# Patient Record
Sex: Male | Born: 1990
Health system: Southern US, Community
[De-identification: ages and names within clinical notes are randomized; demographics above are authoritative.]

## PROBLEM LIST (undated history)

## (undated) DIAGNOSIS — E119 Type 2 diabetes mellitus without complications: Secondary | ICD-10-CM

## (undated) HISTORY — PX: SHOULDER SURGERY: SHX246

## (undated) HISTORY — PX: CHOLECYSTECTOMY: SHX55

---
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
## 2017-10-24 DIAGNOSIS — Z79899 Other long term (current) drug therapy: Secondary | ICD-10-CM | POA: Diagnosis not present

## 2017-10-24 DIAGNOSIS — Z6841 Body Mass Index (BMI) 40.0 and over, adult: Secondary | ICD-10-CM | POA: Diagnosis not present

## 2017-10-24 DIAGNOSIS — F988 Other specified behavioral and emotional disorders with onset usually occurring in childhood and adolescence: Secondary | ICD-10-CM | POA: Diagnosis not present

## 2017-10-24 DIAGNOSIS — R635 Abnormal weight gain: Secondary | ICD-10-CM | POA: Diagnosis not present

## 2017-12-12 DIAGNOSIS — Z6841 Body Mass Index (BMI) 40.0 and over, adult: Secondary | ICD-10-CM | POA: Diagnosis not present

## 2017-12-12 DIAGNOSIS — F988 Other specified behavioral and emotional disorders with onset usually occurring in childhood and adolescence: Secondary | ICD-10-CM | POA: Diagnosis not present

## 2017-12-12 DIAGNOSIS — Z79899 Other long term (current) drug therapy: Secondary | ICD-10-CM | POA: Diagnosis not present

## 2017-12-12 DIAGNOSIS — R635 Abnormal weight gain: Secondary | ICD-10-CM | POA: Diagnosis not present

## 2018-01-16 DIAGNOSIS — Z6841 Body Mass Index (BMI) 40.0 and over, adult: Secondary | ICD-10-CM | POA: Diagnosis not present

## 2018-01-16 DIAGNOSIS — F988 Other specified behavioral and emotional disorders with onset usually occurring in childhood and adolescence: Secondary | ICD-10-CM | POA: Diagnosis not present

## 2018-01-16 DIAGNOSIS — Z79899 Other long term (current) drug therapy: Secondary | ICD-10-CM | POA: Diagnosis not present

## 2018-01-16 DIAGNOSIS — R635 Abnormal weight gain: Secondary | ICD-10-CM | POA: Diagnosis not present

## 2018-03-01 DIAGNOSIS — E119 Type 2 diabetes mellitus without complications: Secondary | ICD-10-CM | POA: Diagnosis not present

## 2018-03-01 DIAGNOSIS — R109 Unspecified abdominal pain: Secondary | ICD-10-CM | POA: Diagnosis not present

## 2018-03-01 DIAGNOSIS — K8020 Calculus of gallbladder without cholecystitis without obstruction: Secondary | ICD-10-CM | POA: Diagnosis not present

## 2018-03-03 DIAGNOSIS — K760 Fatty (change of) liver, not elsewhere classified: Secondary | ICD-10-CM | POA: Diagnosis not present

## 2018-03-03 DIAGNOSIS — K8020 Calculus of gallbladder without cholecystitis without obstruction: Secondary | ICD-10-CM | POA: Diagnosis not present

## 2018-03-03 DIAGNOSIS — R1011 Right upper quadrant pain: Secondary | ICD-10-CM | POA: Insufficient documentation

## 2018-03-03 DIAGNOSIS — Z6841 Body Mass Index (BMI) 40.0 and over, adult: Secondary | ICD-10-CM | POA: Insufficient documentation

## 2018-03-06 DIAGNOSIS — K8010 Calculus of gallbladder with chronic cholecystitis without obstruction: Secondary | ICD-10-CM | POA: Diagnosis not present

## 2018-03-06 DIAGNOSIS — Z5331 Laparoscopic surgical procedure converted to open procedure: Secondary | ICD-10-CM | POA: Diagnosis not present

## 2018-03-06 DIAGNOSIS — E119 Type 2 diabetes mellitus without complications: Secondary | ICD-10-CM | POA: Diagnosis not present

## 2018-03-06 DIAGNOSIS — Z7984 Long term (current) use of oral hypoglycemic drugs: Secondary | ICD-10-CM | POA: Diagnosis not present

## 2018-03-07 DIAGNOSIS — E119 Type 2 diabetes mellitus without complications: Secondary | ICD-10-CM | POA: Diagnosis not present

## 2018-03-07 DIAGNOSIS — K8010 Calculus of gallbladder with chronic cholecystitis without obstruction: Secondary | ICD-10-CM | POA: Diagnosis not present

## 2018-03-07 DIAGNOSIS — Z7984 Long term (current) use of oral hypoglycemic drugs: Secondary | ICD-10-CM | POA: Diagnosis not present

## 2018-03-17 DIAGNOSIS — Z79899 Other long term (current) drug therapy: Secondary | ICD-10-CM | POA: Diagnosis not present

## 2018-03-17 DIAGNOSIS — Z6841 Body Mass Index (BMI) 40.0 and over, adult: Secondary | ICD-10-CM | POA: Diagnosis not present

## 2018-03-17 DIAGNOSIS — E1165 Type 2 diabetes mellitus with hyperglycemia: Secondary | ICD-10-CM | POA: Diagnosis not present

## 2018-03-17 DIAGNOSIS — Z0000 Encounter for general adult medical examination without abnormal findings: Secondary | ICD-10-CM | POA: Diagnosis not present

## 2018-03-23 DIAGNOSIS — Z09 Encounter for follow-up examination after completed treatment for conditions other than malignant neoplasm: Secondary | ICD-10-CM | POA: Insufficient documentation

## 2018-04-09 DIAGNOSIS — Z020 Encounter for examination for admission to educational institution: Secondary | ICD-10-CM | POA: Diagnosis not present

## 2018-04-09 DIAGNOSIS — Z6841 Body Mass Index (BMI) 40.0 and over, adult: Secondary | ICD-10-CM | POA: Diagnosis not present

## 2019-05-08 ENCOUNTER — Emergency Department (HOSPITAL_COMMUNITY)
Admission: EM | Admit: 2019-05-08 | Discharge: 2019-05-08 | Disposition: A | Discharge status: Home / Self Care | Payer: No Typology Code available for payment source | Attending: Emergency Medicine | Admitting: Emergency Medicine

## 2019-05-08 ENCOUNTER — Emergency Department (HOSPITAL_COMMUNITY): Payer: No Typology Code available for payment source

## 2019-05-08 ENCOUNTER — Other Ambulatory Visit: Payer: Self-pay

## 2019-05-08 ENCOUNTER — Encounter (HOSPITAL_COMMUNITY): Payer: Self-pay

## 2019-05-08 DIAGNOSIS — R0789 Other chest pain: Secondary | ICD-10-CM | POA: Diagnosis not present

## 2019-05-08 DIAGNOSIS — Z79899 Other long term (current) drug therapy: Secondary | ICD-10-CM | POA: Insufficient documentation

## 2019-05-08 DIAGNOSIS — R739 Hyperglycemia, unspecified: Secondary | ICD-10-CM

## 2019-05-08 DIAGNOSIS — E1165 Type 2 diabetes mellitus with hyperglycemia: Secondary | ICD-10-CM | POA: Insufficient documentation

## 2019-05-08 DIAGNOSIS — Z7984 Long term (current) use of oral hypoglycemic drugs: Secondary | ICD-10-CM | POA: Diagnosis not present

## 2019-05-08 HISTORY — DX: Type 2 diabetes mellitus without complications: E11.9

## 2019-05-08 LAB — URINALYSIS, ROUTINE W REFLEX MICROSCOPIC
Bacteria, UA: NONE SEEN
Bilirubin Urine: NEGATIVE
Glucose, UA: >=500 mg/dL — AB
Hgb urine dipstick: NEGATIVE
Ketones, ur: 20 mg/dL — AB
Leukocytes,Ua: NEGATIVE
Nitrite: NEGATIVE
Protein, ur: 30 mg/dL — AB
Specific Gravity, Urine: 1.037 — ABNORMAL HIGH (ref 1.005–1.030)
pH: 5 (ref 5.0–8.0)

## 2019-05-08 LAB — CBC
HCT: 47.1 % (ref 39.0–52.0)
Hemoglobin: 16 g/dL (ref 13.0–17.0)
MCH: 28.9 pg (ref 26.0–34.0)
MCHC: 34 g/dL (ref 30.0–36.0)
MCV: 85.2 fL (ref 80.0–100.0)
Platelets: 201 10*3/uL (ref 150–400)
RBC: 5.53 MIL/uL (ref 4.22–5.81)
RDW: 12 % (ref 11.5–15.5)
WBC: 7 10*3/uL (ref 4.0–10.5)
nRBC: 0 % (ref 0.0–0.2)

## 2019-05-08 LAB — BASIC METABOLIC PANEL
Anion gap: 11 (ref 5–15)
BUN: 15 mg/dL (ref 6–20)
CO2: 21 mmol/L — ABNORMAL LOW (ref 22–32)
Calcium: 8.5 mg/dL — ABNORMAL LOW (ref 8.9–10.3)
Chloride: 103 mmol/L (ref 98–111)
Creatinine, Ser: 0.84 mg/dL (ref 0.61–1.24)
GFR calc Af Amer: >60 mL/min (ref >60)
GFR calc non Af Amer: >60 mL/min (ref >60)
Glucose, Bld: 256 mg/dL — ABNORMAL HIGH (ref 70–99)
Potassium: 3.7 mmol/L (ref 3.5–5.1)
Sodium: 135 mmol/L (ref 135–145)

## 2019-05-08 LAB — RAPID URINE DRUG SCREEN, HOSP PERFORMED
Amphetamines: POSITIVE — AB
Barbiturates: NOT DETECTED
Benzodiazepines: NOT DETECTED
Cocaine: NOT DETECTED
Opiates: NOT DETECTED
Tetrahydrocannabinol: POSITIVE — AB

## 2019-05-08 LAB — CBG MONITORING, ED: Glucose-Capillary: 233 mg/dL — ABNORMAL HIGH (ref 70–99)

## 2019-05-08 LAB — TROPONIN I (HIGH SENSITIVITY)
Troponin I (High Sensitivity): 3 ng/L (ref <18)
Troponin I (High Sensitivity): 3 ng/L (ref <18)

## 2019-05-08 NOTE — ED Triage Notes (Signed)
Pt reports left sided chest pain that began at 0230. Pain woke him up. Pain was intermittent . Pt reports diaphoresis. Pt woke up and was better. Then reports that he went to [facility] and has left arm pain and chest tightness. Went to urgent care and sent here due to being diaphorrctic. Given asa and nitro with relief

## 2019-05-08 NOTE — Discharge Instructions (Signed)
It is important for you to follow up with your primary doctor this week as discussed arrange better control of your diabetes, but also to discuss further testing to ensure heart health.  Your lab test, EKGs and chest x-ray today are reassuring, but you would benefit from a cardiology follow-up to confirm heart health.  You are being given a referral to our local cardiologist, however you may want to discuss this with your primary doctor for cardiology follow-up closer to home.  Get rechecked immediately for any return or worsening symptoms.

## 2019-05-08 NOTE — ED Provider Notes (Signed)
[facility] Provider Note   CSN: [id number] Arrival date & time: 05/08/19  1235    History   Chief Complaint Chief Complaint  Patient presents with  . Chest Pain    HPI Coralee RudJason A Cesaro is a 28 y.o. male with a history of currently untreated diabetes presenting with left-sided chest pressure in association with diaphoresis which woke him from sleep around 230 today, and has been intermittently present since just prior to arrival.  He is currently camping here locally, his symptoms are not associated with exertion, however [facility] 1030 his symptoms escalated, describing left-sided chest pressure with radiation into his left arm, again with diaphoresis, but without nausea, vomiting, shortness of breath.  He presented at a local urgent care center and was transported here.  He received aspirin 324 mg and also nitroglycerin and had symptomatic relief.  No strong family history of CAD.  Patient is a non-smoker.  Daily EtOH use.  HPI: A 28 year old patient with a history of treated diabetes and obesity presents for evaluation of chest pain. Initial onset of pain was more than 6 hours ago. The patient's chest pain is described as heaviness/pressure/tightness, is not worse with exertion and is relieved by nitroglycerin. The patient reports some diaphoresis. The patient's chest pain is middle- or left-sided, is not well-localized, is not sharp and does radiate to the arms/jaw/neck. The patient does not complain of nausea. The patient has no history of stroke, has no history of peripheral artery disease, has not smoked in the past 90 days, has no relevant family history of coronary artery disease (first degree relative at less than age 30), is not hypertensive and has no history of hypercholesterolemia.   The history is provided by the patient.    Past Medical History:  Diagnosis Date  . Diabetes mellitus without complication (HCC)      There are no active problems to display for this patient.         Home Medications    Prior to Admission medications   Medication Sig Start Date End Date Taking? Authorizing Provider  amphetamine-dextroamphetamine (ADDERALL XR) 10 MG 24 hr capsule Take 10 mg by mouth every morning.    [provider]  metFORMIN (GLUCOPHAGE) 500 MG tablet Take 1 tablet by mouth 2 (two) times a day.    [provider]    Family History No family history on file.  Social History Social History   Tobacco Use  . Smoking status: Not on file  Substance Use Topics  . Alcohol use: Yes    Comment: 4-5 beer on weekend   . Drug use: Yes    Types: Marijuana     Allergies   Patient has no known allergies.   Review of Systems Review of Systems  Constitutional: Positive for diaphoresis. Negative for fever.  HENT: Negative for congestion and sore throat.   Eyes: Negative.   Respiratory: Positive for chest tightness. Negative for shortness of breath.   Cardiovascular: Negative for chest pain.  Gastrointestinal: Positive for nausea. Negative for abdominal pain.  Genitourinary: Negative.   Musculoskeletal: Negative for arthralgias, joint swelling and neck pain.  Skin: Negative.  Negative for rash and wound.  Neurological: Negative for dizziness, weakness, light-headedness, numbness and headaches.  Psychiatric/Behavioral: Negative.      Physical Exam Updated Vital Signs BP 123/83   Pulse 94   Temp 98.3 F (36.8 C) (Oral)   Resp 17   Ht 6\' 3"  (  1.905 m)   SpO2 99%   Physical Exam Vitals signs and nursing note reviewed.  Constitutional:      Appearance: He is well-developed.  HENT:     Head: Normocephalic and atraumatic.  Eyes:     Conjunctiva/sclera: Conjunctivae normal.  Neck:     Musculoskeletal: Normal range of motion.  Cardiovascular:     Rate and Rhythm: Normal rate and regular rhythm.     Heart sounds: Normal heart sounds.  Pulmonary:     Effort: Pulmonary  effort is normal.     Breath sounds: Normal breath sounds. No wheezing or rhonchi.  Abdominal:     General: Bowel sounds are normal.     Palpations: Abdomen is soft.     Tenderness: There is no abdominal tenderness.  Musculoskeletal: Normal range of motion.  Skin:    General: Skin is warm and dry.  Neurological:     Mental Status: He is alert.      ED Treatments / Results  Labs (all labs ordered are listed, but only abnormal results are displayed) Labs Reviewed  BASIC METABOLIC PANEL - Abnormal; Notable for the following components:      Result Value   CO2 21 (*)    Glucose, Bld 256 (*)    Calcium 8.5 (*)    All other components within normal limits  URINALYSIS, ROUTINE W REFLEX MICROSCOPIC - Abnormal; Notable for the following components:   Specific Gravity, Urine 1.037 (*)    Glucose, UA >=500 (*)    Ketones, ur 20 (*)    Protein, ur 30 (*)    All other components within normal limits  RAPID URINE DRUG SCREEN, HOSP PERFORMED - Abnormal; Notable for the following components:   Amphetamines POSITIVE (*)    Tetrahydrocannabinol POSITIVE (*)    All other components within normal limits  CBG MONITORING, ED - Abnormal; Notable for the following components:   Glucose-Capillary 233 (*)    All other components within normal limits  TROPONIN I (HIGH SENSITIVITY)  TROPONIN I (HIGH SENSITIVITY)  CBC    EKG EKG Interpretation  Date/Time:  Saturday May 08 2019 15:27:37 EDT Ventricular Rate:  96 PR Interval:    QRS Duration: 91 QT Interval:  342 QTC Calculation: 433 R Axis:   38 Text Interpretation:  Sinus rhythm Prolonged PR interval Low voltage, precordial leads Borderline T abnormalities, anterior leads Confirmed by Bethann BerkshireZammit, Joseph [phone]([id number]) on 05/08/2019 4:05:20 PM   Radiology Dg Chest Portable 1 View  Result Date: 05/08/2019 CLINICAL DATA:  Chest pain.  Diaphoresis.  Left arm pain. EXAM: PORTABLE CHEST 1 VIEW COMPARISON:  X-ray dated 02/22/2009 FINDINGS: The heart size  and mediastinal contours are within normal limits. Both lungs are clear. The visualized skeletal structures are unremarkable. IMPRESSION: Normal exam. Electronically Signed   By: Francene BoyersJames  Maxwell M.D.   On: 05/08/2019 14:02    Procedures Procedures (including critical care time)  Medications Ordered in ED Medications - No data to display   Initial Impression / Assessment and Plan / ED Course  I have reviewed the triage vital signs and the nursing notes.  Pertinent labs & imaging results that were available during my care of the patient were reviewed by me and considered in my medical decision making (see chart for details).     HEAR Score: 4  4:17 PM Pt still pain free. Pending second troponin.  Pt with sx distribution suggesting acs except it is not exertional and not associated with sob,n/v. Pt has been pain  and sx free here.  Delta troponin stable and normal range.  ekgs also unchanged. Doubt ACS.  VSS, no tachypnea or tachycardia, no risk factors for PE.   He reports has appt with his pcp this week (in [place]) to get back on his DM meds.    Hyperglycemia, pt with known DM.  He has not been compliant with taking any DM meds, metformin caused diarrhea, other meds tried by not covered by insurance.  No dka, pt to f/u with pcp this week regarding DM, also advised further eval by cardiology as outpt.  Referral given for this.  Pt agreeable and understands plan. Strict return precautions given.   Final Clinical Impressions(s) / ED Diagnoses   Final diagnoses:  Atypical chest pain  Hyperglycemia    ED Discharge Orders    None       Landis Martins 05/08/19 1711    Noemi Chapel, MD 05/11/19 [phone]

## 2019-09-03 ENCOUNTER — Ambulatory Visit (INDEPENDENT_AMBULATORY_CARE_PROVIDER_SITE_OTHER): Payer: No Typology Code available for payment source

## 2019-09-03 ENCOUNTER — Encounter: Payer: Self-pay | Admitting: Sports Medicine

## 2019-09-03 ENCOUNTER — Other Ambulatory Visit: Payer: Self-pay

## 2019-09-03 ENCOUNTER — Telehealth: Payer: Self-pay | Admitting: *Deleted

## 2019-09-03 ENCOUNTER — Ambulatory Visit (INDEPENDENT_AMBULATORY_CARE_PROVIDER_SITE_OTHER): Payer: No Typology Code available for payment source | Admitting: Sports Medicine

## 2019-09-03 DIAGNOSIS — S93602A Unspecified sprain of left foot, initial encounter: Secondary | ICD-10-CM

## 2019-09-03 DIAGNOSIS — M25572 Pain in left ankle and joints of left foot: Secondary | ICD-10-CM | POA: Diagnosis not present

## 2019-09-03 DIAGNOSIS — S96912A Strain of unspecified muscle and tendon at ankle and foot level, left foot, initial encounter: Secondary | ICD-10-CM

## 2019-09-03 DIAGNOSIS — R58 Hemorrhage, not elsewhere classified: Secondary | ICD-10-CM

## 2019-09-03 NOTE — Telephone Encounter (Signed)
-----   Message from Landis Martins, [place] sent at 09/03/2019 11:57 AM EDT ----- Regarding: MRI Left foot and ankle Severe pain and sprain of foot, fell and twisting foot after hike unable to flex or move ankle on left with ecchymosis to all toes and to ankle. MRI r/o ligament tear

## 2019-09-03 NOTE — Telephone Encounter (Signed)
Orders to J. Quintana, RN for pre-cert, faxed to [facility]. 

## 2019-09-03 NOTE — Telephone Encounter (Signed)
I called pt and asked if he would like to have the MRI at [facility] facility and he said yes.

## 2019-09-03 NOTE — Progress Notes (Signed)
Subjective:  Jonathan Hendrix is a 28 y.o. male patient who presents to office for evaluation of left foot and ankle pain. Patient complains of continued pain in the ankle unable to bend it is severe bruising to the toes after twisting his foot and ankle while he was hiking where he was going down heel and got his foot caught on a root.  Patient reports that he has been dealing with swelling discolored toes so he has been using crutches icing elevating and ibuprofen reports that it feels very tight around the toes and the ankle continues to hurt cannot flex it reports that there is sharp pain since the injury when he is attempting to put pressure on the foot so he has resulted to using crutches pain is reduced when he is off it to 3 out of 10. Patient denies any other pedal complaints at this time.  Review of Systems  All other systems reviewed and are negative.    Patient Active Problem List   Diagnosis Date Noted  . Postoperative examination 03/23/2018  . BMI 40.0-44.9, adult (HCC) 03/03/2018  . Gallstones 03/03/2018  . Morbid obesity (HCC) 03/03/2018  . Right upper quadrant pain 03/03/2018    No current outpatient medications on file prior to visit.   No current facility-administered medications on file prior to visit.     No Known Allergies  Objective:  General: Alert and oriented x3 in no acute distress  Dermatology: No open lesions bilateral lower extremities, no webspace macerations, moderate ecchymosis to the bases of toes 234 and 5 on left, all nails x 10 are well manicured.  Vascular: Dorsalis Pedis and Posterior Tibial pedal pulses palpable, Capillary Fill Time 3 seconds,(+) pedal hair growth bilateral, focal edema to the left foot and ankle temperature gradient within normal limits.  Neurology: Michaell Cowing sensation intact via light touch bilateral.  Musculoskeletal: Mild tenderness with palpation at anterior ankle with pain with dorsiflexion of the left ankle, there is mild  tenderness to palpation of the sinus tarsi, guarding with range of motion to left ankle.  No pain with calf pressure on left.  There is also mild pain with dorsiflexion of lesser digits on the left.    Gait: Antalgic gait, crutch assisted  Xrays  Left foot and ankle   Impression: There is mild joint space narrowing consistent with early arthritis versus old history of remote injury or trauma, at the head of the talus there is a small chip avulsion hard to determine if this chip avulsion is new versus old.  There is mild soft tissue swelling.  No other acute findings.  Assessment and Plan: Problem List Items Addressed This Visit    None    Visit Diagnoses    Tendon tear, ankle, left, initial encounter    -  Primary   Acute left ankle pain       Relevant Orders   DG Ankle Complete Left   Sprain of left foot, initial encounter       Ecchymosis           -Complete examination performed -Xrays reviewed -Discussed treatment options for likely sprain versus tear -Rx MRI to rule out tear since there is pain and severity of lack of motion at the ankle -Applied Unna boot for patient to keep intact for 5 days after removal may use surgitube compression sleeve -Continue with crutches and nonweightbearing rest ice elevation and ibuprofen as needed for pain -Patient to refrain out of work until MRI is  completed we will call patient for review of MRI results once he has completed this test to determine if he can weight-bear with cam boot and return to work -Patient to return to office after MRI or sooner if condition worsens.  Landis Martins, DPM

## 2019-09-06 ENCOUNTER — Other Ambulatory Visit: Payer: Self-pay | Admitting: Sports Medicine

## 2019-09-06 DIAGNOSIS — S96912A Strain of unspecified muscle and tendon at ankle and foot level, left foot, initial encounter: Secondary | ICD-10-CM

## 2019-09-10 ENCOUNTER — Ambulatory Visit (HOSPITAL_COMMUNITY)
Admission: RE | Admit: 2019-09-10 | Discharge: 2019-09-10 | Disposition: A | Discharge status: Home / Self Care | Payer: No Typology Code available for payment source | Source: Ambulatory Visit | Attending: Sports Medicine | Admitting: Sports Medicine

## 2019-09-10 ENCOUNTER — Other Ambulatory Visit: Payer: Self-pay

## 2019-09-10 DIAGNOSIS — S93602A Unspecified sprain of left foot, initial encounter: Secondary | ICD-10-CM | POA: Insufficient documentation

## 2019-09-10 DIAGNOSIS — S96912A Strain of unspecified muscle and tendon at ankle and foot level, left foot, initial encounter: Secondary | ICD-10-CM | POA: Insufficient documentation

## 2019-09-10 DIAGNOSIS — R58 Hemorrhage, not elsewhere classified: Secondary | ICD-10-CM | POA: Insufficient documentation

## 2019-09-10 DIAGNOSIS — M25572 Pain in left ankle and joints of left foot: Secondary | ICD-10-CM | POA: Insufficient documentation

## 2019-09-13 ENCOUNTER — Telehealth: Payer: Self-pay | Admitting: *Deleted

## 2019-09-13 NOTE — Telephone Encounter (Signed)
I informed pt of  Dr. Stover's statement. 

## 2019-09-13 NOTE — Telephone Encounter (Signed)
Pt states he had MRI of the foot and ankle, request results.

## 2019-09-13 NOTE — Telephone Encounter (Signed)
There's nothing acute. We will discuss at his appt on 11-6

## 2019-09-17 ENCOUNTER — Ambulatory Visit (INDEPENDENT_AMBULATORY_CARE_PROVIDER_SITE_OTHER): Payer: No Typology Code available for payment source | Admitting: Sports Medicine

## 2019-09-17 ENCOUNTER — Encounter: Payer: Self-pay | Admitting: Sports Medicine

## 2019-09-17 ENCOUNTER — Other Ambulatory Visit: Payer: Self-pay

## 2019-09-17 DIAGNOSIS — M25572 Pain in left ankle and joints of left foot: Secondary | ICD-10-CM

## 2019-09-17 DIAGNOSIS — R58 Hemorrhage, not elsewhere classified: Secondary | ICD-10-CM | POA: Diagnosis not present

## 2019-09-17 DIAGNOSIS — S93602D Unspecified sprain of left foot, subsequent encounter: Secondary | ICD-10-CM

## 2019-09-17 NOTE — Progress Notes (Signed)
Subjective:  Jonathan Hendrix is a 27 y.o. male patient who returns to office for follow-up evaluation of left foot and ankle pain.  Patient reports that the foot feels a lot better can walk now in a normal tennis shoe with the support sleeve to help with the swelling reports that there is a aching pain 2-3 out of 10 worse first in the morning and late in the evening has been icing elevating taking ibuprofen and reports that the swelling and bruising is getting better.  Patient is also here for review of MRI results and to discuss return to work.  Patient denies any new trauma or injury or any frank instability.  Patient Active Problem List   Diagnosis Date Noted  . Postoperative examination 03/23/2018  . BMI 40.0-44.9, adult ([place]) 03/03/2018  . Gallstones 03/03/2018  . Morbid obesity ([place]) 03/03/2018  . Right upper quadrant pain 03/03/2018    No current outpatient medications on file prior to visit.   No current facility-administered medications on file prior to visit.     No Known Allergies  Objective:  General: Alert and oriented x3 in no acute distress  Dermatology: No open lesions bilateral lower extremities, no webspace macerations, reduced ecchymosis to the bases of toes 234 and 5 on left, all nails x 10 are well manicured.  Vascular: Dorsalis Pedis and Posterior Tibial pedal pulses palpable, Capillary Fill Time 3 seconds,(+) pedal hair growth bilateral, focal edema to the left foot and ankle temperature gradient within normal limits.  Neurology: Johney Maine sensation intact via light touch bilateral.  Musculoskeletal: Much improved and decreased tenderness with palpation at anterior ankle with pain with dorsiflexion of the left ankle, there is mild tenderness to palpation of the sinus tarsi, reduced guarding with range of motion to left ankle.  No pain with calf pressure on left.  There is also mild pain with dorsiflexion of lesser digits on the left.    MRI negative for foot MRI left  ankle suggestive of chronic ATFL ligament tears as well as chronic Achilles tendinopathy with Haglund's deformity and subtalar joint effusions that are nonspecific.  Assessment and Plan: Problem List Items Addressed This Visit    None    Visit Diagnoses    Acute left ankle pain    -  Primary   Sprain of left foot, subsequent encounter       Ecchymosis           -Complete examination performed -MRI results reviewed -Discussed long-term care for sprain injury -Dispense Tri-Lock ankle brace for patient to use daily for the next month and then may slowly wean as symptoms improve -Continue with ibuprofen rest ice elevation as needed for additional pain and ecchymosis relief -Encouraged home PT and to avoid any strenuous workout activities over the next month -Patient may return to work with ankle support starting 09/20/2019 -Patient to return to office as needed or sooner if condition worsens.  Landis Martins, DPM

## 2020-01-20 MED FILL — CYCLOBENZAPRINE HCL 5 MG TA: 5 | 3 days supply | Qty: 10 | Fill #0

## 2020-01-20 MED FILL — predniSONE 10 MG TABS: 10 | 6 days supply | Qty: 21 | Fill #0

## 2020-08-21 ENCOUNTER — Other Ambulatory Visit: Payer: Self-pay

## 2020-08-21 ENCOUNTER — Inpatient Hospital Stay: Payer: No Typology Code available for payment source | Attending: Internal Medicine

## 2020-08-21 DIAGNOSIS — Z23 Encounter for immunization: Secondary | ICD-10-CM | POA: Diagnosis not present

## 2020-12-01 IMAGING — CR PORTABLE CHEST - 1 VIEW
1 series · 2 of 2 positions shown · non-contrast
Comparison: X-ray dated 02/22/2009

CLINICAL DATA: Chest pain.  Diaphoresis.  Left arm pain.

EXAM:
PORTABLE CHEST 1 VIEW

[Series 1: portable · 0.17mm/px · 2 of 2 slices shown]
[im 1/2]
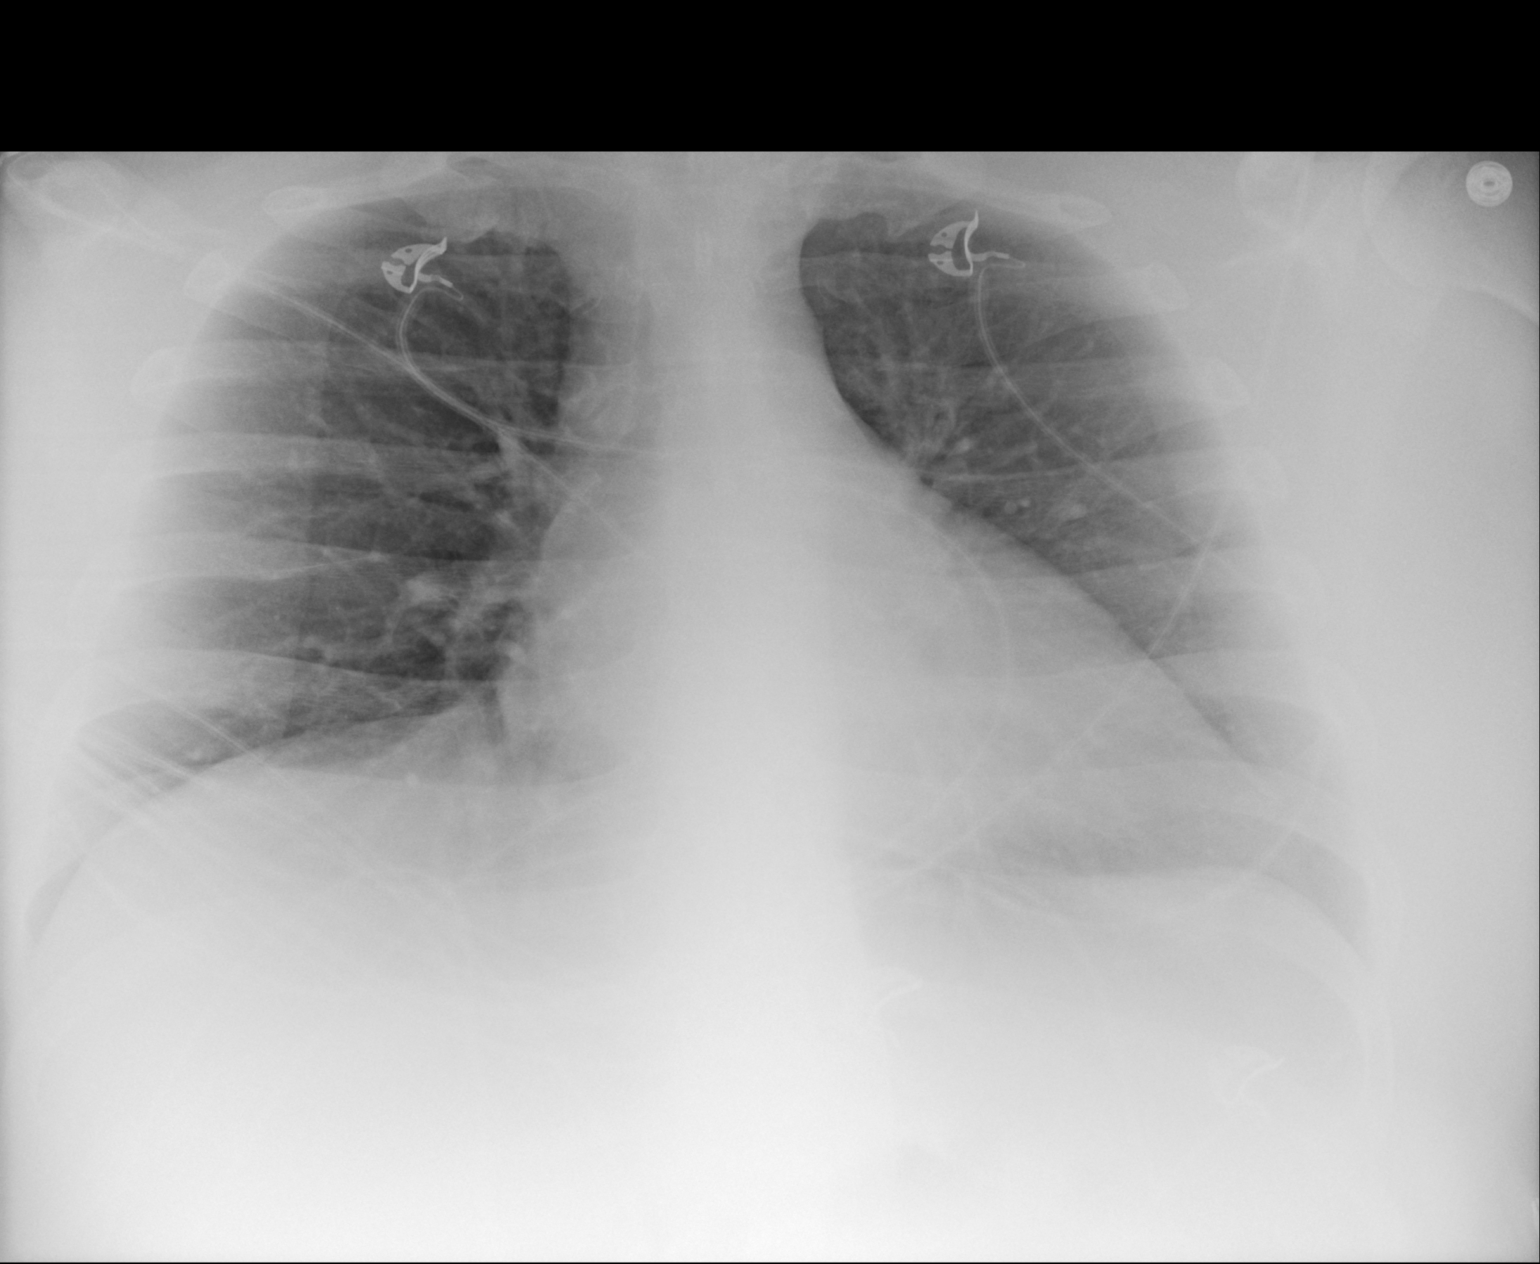
[im 2/2]
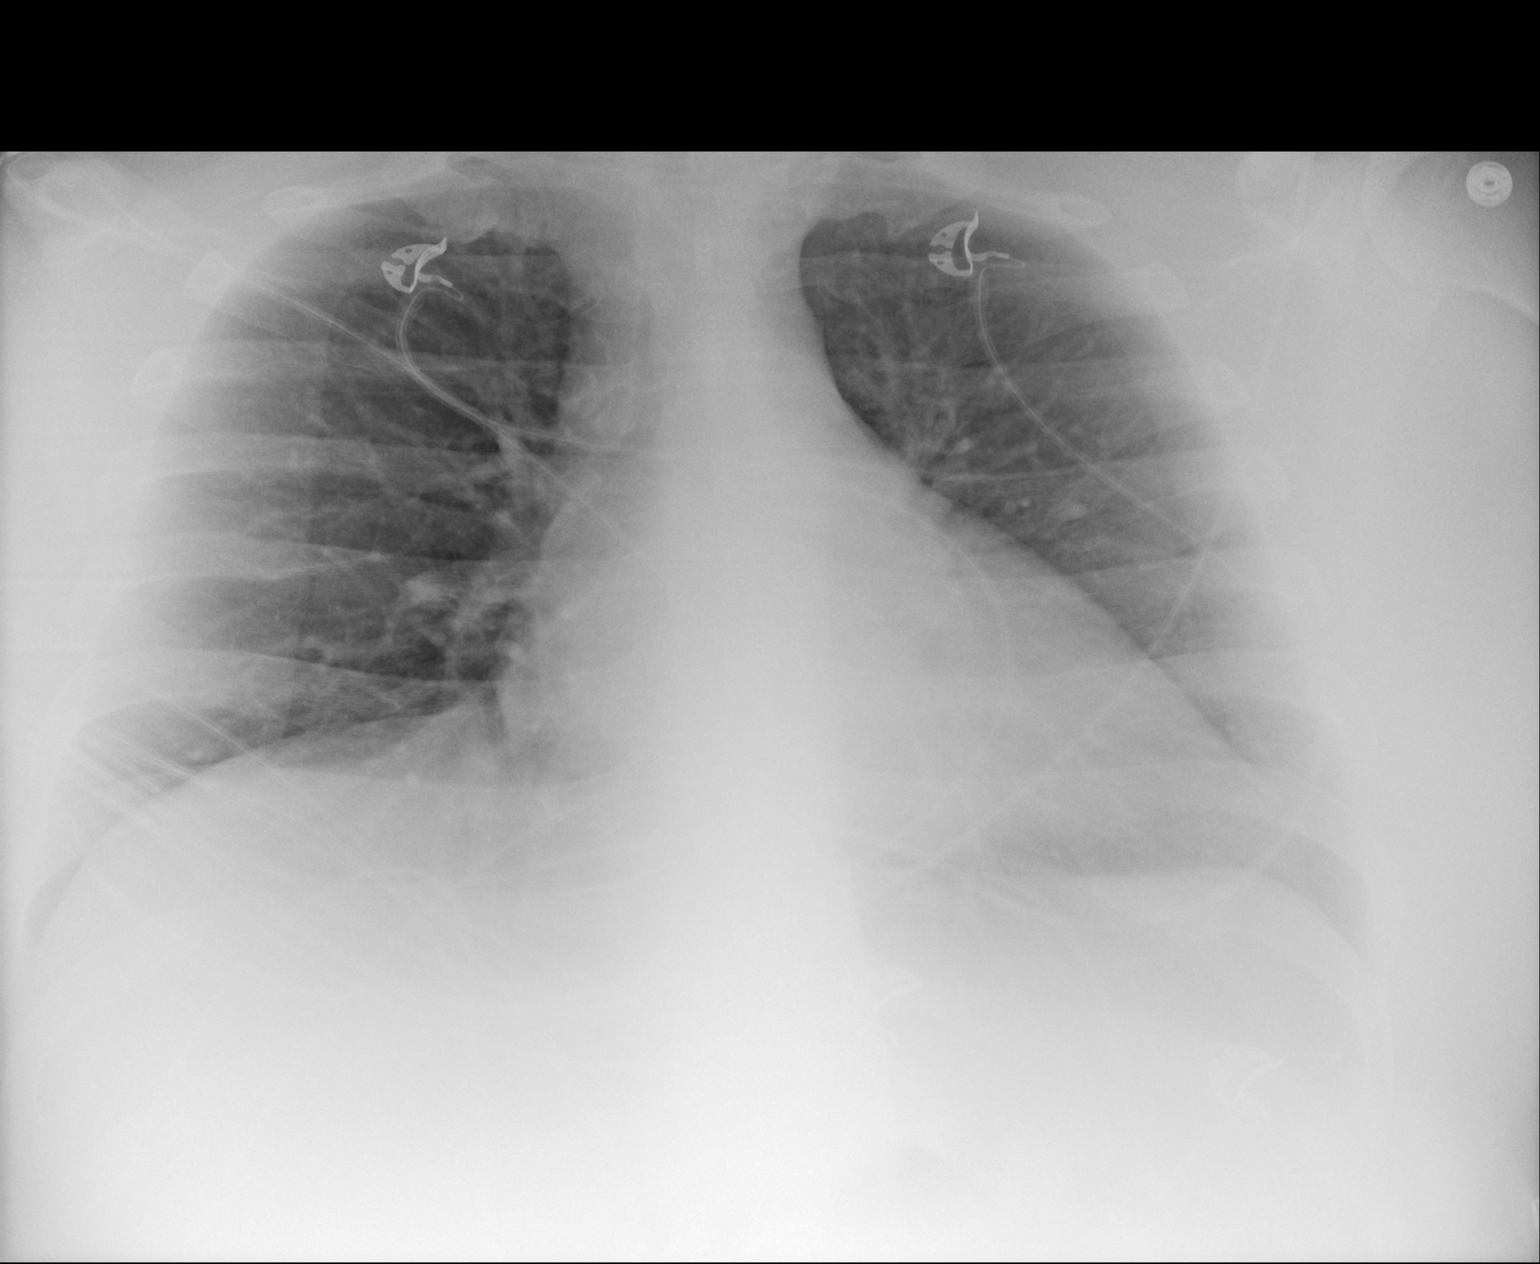

[2 of 2 positions shown; findings below may reference images not displayed]

FINDINGS: The heart size and mediastinal contours are within normal limits.
Both lungs are clear. The visualized skeletal structures are
unremarkable.
IMPRESSION: Normal exam.

## 2021-05-01 ENCOUNTER — Other Ambulatory Visit (HOSPITAL_COMMUNITY): Payer: Self-pay

## 2021-05-01 MED ORDER — BYDUREON BCISE 2 MG/0.85ML ~~LOC~~ AUIJ
AUTO-INJECTOR | SUBCUTANEOUS | 4 refills | Status: DC
  Filled 2021-05-01: qty 3.4, 28d supply, fill #0
  Filled 2021-06-20: qty 3.4, 28d supply, fill #1
  Filled 2022-01-21: qty 3.4, 28d supply, fill #2

## 2021-05-02 ENCOUNTER — Other Ambulatory Visit (HOSPITAL_COMMUNITY): Payer: Self-pay

## 2021-05-10 ENCOUNTER — Other Ambulatory Visit (HOSPITAL_COMMUNITY): Payer: Self-pay

## 2021-06-20 ENCOUNTER — Other Ambulatory Visit (HOSPITAL_COMMUNITY): Payer: Self-pay

## 2021-11-08 ENCOUNTER — Other Ambulatory Visit (HOSPITAL_COMMUNITY): Payer: Self-pay

## 2022-01-21 ENCOUNTER — Other Ambulatory Visit (HOSPITAL_COMMUNITY): Payer: Self-pay

## 2022-01-22 ENCOUNTER — Other Ambulatory Visit (HOSPITAL_COMMUNITY): Payer: Self-pay

## 2022-01-22 MED ORDER — ATORVASTATIN CALCIUM 10 MG PO TABS
ORAL_TABLET | ORAL | 1 refills | Status: DC
  Filled 2022-01-22: qty 90, 90d supply, fill #0

## 2022-01-22 MED ORDER — SERTRALINE HCL 50 MG PO TABS
ORAL_TABLET | ORAL | 2 refills | Status: DC
  Filled 2022-01-22: qty 180, 90d supply, fill #0

## 2022-01-23 ENCOUNTER — Other Ambulatory Visit (HOSPITAL_COMMUNITY): Payer: Self-pay

## 2022-07-01 ENCOUNTER — Other Ambulatory Visit (HOSPITAL_COMMUNITY): Payer: Self-pay

## 2022-07-02 ENCOUNTER — Other Ambulatory Visit (HOSPITAL_COMMUNITY): Payer: Self-pay

## 2022-07-02 MED ORDER — BYDUREON BCISE 2 MG/0.85ML ~~LOC~~ AUIJ
AUTO-INJECTOR | SUBCUTANEOUS | 5 refills | Status: DC
  Filled 2022-07-02: qty 3.4, 30d supply, fill #0

## 2022-07-12 ENCOUNTER — Other Ambulatory Visit (HOSPITAL_COMMUNITY): Payer: Self-pay

## 2022-07-16 ENCOUNTER — Other Ambulatory Visit (HOSPITAL_COMMUNITY): Payer: Self-pay

## 2022-07-17 ENCOUNTER — Other Ambulatory Visit (HOSPITAL_COMMUNITY): Payer: Self-pay

## 2022-07-17 MED ORDER — ZOLPIDEM TARTRATE 10 MG PO TABS
10.0000 mg | ORAL_TABLET | Freq: Every day | ORAL | 0 refills | Status: DC
  Filled 2022-07-17: qty 30, 30d supply, fill #0

## 2022-07-17 MED ORDER — ATORVASTATIN CALCIUM 10 MG PO TABS
10.0000 mg | ORAL_TABLET | Freq: Every day | ORAL | 3 refills | Status: DC
  Filled 2022-07-17: qty 90, 90d supply, fill #0

## 2022-07-17 MED ORDER — RYBELSUS 7 MG PO TABS
7.0000 mg | ORAL_TABLET | Freq: Every day | ORAL | 3 refills | Status: DC
  Filled 2022-10-28: qty 30, 30d supply, fill #0
  Filled 2022-12-23: qty 30, 30d supply, fill #1

## 2022-07-17 MED ORDER — FREESTYLE LIBRE 14 DAY READER DEVI
12 refills | Status: DC
  Filled 2022-07-17: qty 2, 28d supply, fill #0

## 2022-10-28 ENCOUNTER — Other Ambulatory Visit (HOSPITAL_COMMUNITY): Payer: Self-pay

## 2022-10-28 ENCOUNTER — Other Ambulatory Visit: Payer: Self-pay

## 2022-10-29 ENCOUNTER — Other Ambulatory Visit (HOSPITAL_COMMUNITY): Payer: Self-pay

## 2022-10-29 MED ORDER — SERTRALINE HCL 50 MG PO TABS
100.0000 mg | ORAL_TABLET | Freq: Every day | ORAL | 3 refills | Status: DC
  Filled 2022-10-29: qty 180, 90d supply, fill #0

## 2022-10-30 ENCOUNTER — Other Ambulatory Visit: Payer: Self-pay

## 2022-11-12 DIAGNOSIS — M62830 Muscle spasm of back: Secondary | ICD-10-CM | POA: Diagnosis not present

## 2022-11-12 DIAGNOSIS — M9907 Segmental and somatic dysfunction of upper extremity: Secondary | ICD-10-CM | POA: Diagnosis not present

## 2022-11-12 DIAGNOSIS — R293 Abnormal posture: Secondary | ICD-10-CM | POA: Diagnosis not present

## 2022-11-12 DIAGNOSIS — M6249 Contracture of muscle, multiple sites: Secondary | ICD-10-CM | POA: Diagnosis not present

## 2022-11-12 DIAGNOSIS — M9902 Segmental and somatic dysfunction of thoracic region: Secondary | ICD-10-CM | POA: Diagnosis not present

## 2022-11-12 DIAGNOSIS — M9901 Segmental and somatic dysfunction of cervical region: Secondary | ICD-10-CM | POA: Diagnosis not present

## 2022-12-23 ENCOUNTER — Other Ambulatory Visit (HOSPITAL_COMMUNITY): Payer: Self-pay

## 2022-12-31 ENCOUNTER — Other Ambulatory Visit (HOSPITAL_COMMUNITY): Payer: Self-pay

## 2023-01-17 ENCOUNTER — Other Ambulatory Visit (HOSPITAL_COMMUNITY): Payer: Self-pay

## 2023-01-21 ENCOUNTER — Other Ambulatory Visit (HOSPITAL_COMMUNITY): Payer: Self-pay

## 2023-01-21 MED ORDER — RYBELSUS 7 MG PO TABS
7.0000 mg | ORAL_TABLET | Freq: Every day | ORAL | 3 refills | Status: DC
  Filled 2023-01-21: qty 30, 30d supply, fill #0

## 2023-01-23 ENCOUNTER — Other Ambulatory Visit (HOSPITAL_COMMUNITY): Payer: Self-pay

## 2024-01-13 ENCOUNTER — Other Ambulatory Visit (HOSPITAL_COMMUNITY): Payer: Self-pay

## 2024-01-13 ENCOUNTER — Ambulatory Visit: Payer: Commercial Managed Care - PPO | Admitting: Nurse Practitioner

## 2024-01-13 ENCOUNTER — Encounter: Payer: Self-pay | Admitting: Nurse Practitioner

## 2024-01-13 ENCOUNTER — Other Ambulatory Visit: Payer: Self-pay

## 2024-01-13 VITALS — BP 130/88 | HR 60 | Temp 97.8°F | Ht 74.0 in | Wt 274.4 lb

## 2024-01-13 DIAGNOSIS — F419 Anxiety disorder, unspecified: Secondary | ICD-10-CM

## 2024-01-13 DIAGNOSIS — E669 Obesity, unspecified: Secondary | ICD-10-CM

## 2024-01-13 DIAGNOSIS — F32A Depression, unspecified: Secondary | ICD-10-CM | POA: Diagnosis not present

## 2024-01-13 DIAGNOSIS — Z23 Encounter for immunization: Secondary | ICD-10-CM

## 2024-01-13 DIAGNOSIS — N529 Male erectile dysfunction, unspecified: Secondary | ICD-10-CM | POA: Diagnosis not present

## 2024-01-13 DIAGNOSIS — E1165 Type 2 diabetes mellitus with hyperglycemia: Secondary | ICD-10-CM

## 2024-01-13 DIAGNOSIS — Z7985 Long-term (current) use of injectable non-insulin antidiabetic drugs: Secondary | ICD-10-CM | POA: Diagnosis not present

## 2024-01-13 DIAGNOSIS — Z6835 Body mass index (BMI) 35.0-35.9, adult: Secondary | ICD-10-CM | POA: Diagnosis not present

## 2024-01-13 DIAGNOSIS — Z1159 Encounter for screening for other viral diseases: Secondary | ICD-10-CM | POA: Diagnosis not present

## 2024-01-13 DIAGNOSIS — Z114 Encounter for screening for human immunodeficiency virus [HIV]: Secondary | ICD-10-CM

## 2024-01-13 DIAGNOSIS — Z0001 Encounter for general adult medical examination with abnormal findings: Secondary | ICD-10-CM | POA: Insufficient documentation

## 2024-01-13 LAB — CBC
HCT: 48 % (ref 39.0–52.0)
Hemoglobin: 16.3 g/dL (ref 13.0–17.0)
MCHC: 34 g/dL (ref 30.0–36.0)
MCV: 82.1 fl (ref 78.0–100.0)
Platelets: 252 10*3/uL (ref 150.0–400.0)
RBC: 5.85 Mil/uL — ABNORMAL HIGH (ref 4.22–5.81)
RDW: 12.8 % (ref 11.5–15.5)
WBC: 5.9 10*3/uL (ref 4.0–10.5)

## 2024-01-13 LAB — COMPREHENSIVE METABOLIC PANEL
ALT: 19 U/L (ref 0–53)
AST: 16 U/L (ref 0–37)
Albumin: 4.6 g/dL (ref 3.5–5.2)
Alkaline Phosphatase: 73 U/L (ref 39–117)
BUN: 13 mg/dL (ref 6–23)
CO2: 28 meq/L (ref 19–32)
Calcium: 9.9 mg/dL (ref 8.4–10.5)
Chloride: 102 meq/L (ref 96–112)
Creatinine, Ser: 0.88 mg/dL (ref 0.40–1.50)
GFR: 111.92 mL/min (ref >60.00)
Glucose, Bld: 315 mg/dL — ABNORMAL HIGH (ref 70–99)
Potassium: 4.2 meq/L (ref 3.5–5.1)
Sodium: 137 meq/L (ref 135–145)
Total Bilirubin: 0.7 mg/dL (ref 0.2–1.2)
Total Protein: 7.4 g/dL (ref 6.0–8.3)

## 2024-01-13 LAB — LIPID PANEL
Cholesterol: 226 mg/dL — ABNORMAL HIGH (ref 0–200)
HDL: 40.2 mg/dL (ref >39.00)
LDL Cholesterol: 148 mg/dL — ABNORMAL HIGH (ref 0–99)
NonHDL: 185.53
Total CHOL/HDL Ratio: 6
Triglycerides: 189 mg/dL — ABNORMAL HIGH (ref 0.0–149.0)
VLDL: 37.8 mg/dL (ref 0.0–40.0)

## 2024-01-13 LAB — POCT GLYCOSYLATED HEMOGLOBIN (HGB A1C): Hemoglobin A1C: 12.3 % — AB (ref 4.0–5.6)

## 2024-01-13 LAB — MICROALBUMIN / CREATININE URINE RATIO
Creatinine,U: 109.7 mg/dL
Microalb Creat Ratio: 80.8 mg/g — ABNORMAL HIGH (ref 0.0–30.0)
Microalb, Ur: 8.9 mg/dL — ABNORMAL HIGH (ref 0.0–1.9)

## 2024-01-13 LAB — TSH: TSH: 1.72 u[IU]/mL (ref 0.35–5.50)

## 2024-01-13 MED ORDER — SERTRALINE HCL 50 MG PO TABS
ORAL_TABLET | ORAL | 0 refills | Status: DC
  Filled 2024-01-13: qty 23, 30d supply, fill #0

## 2024-01-13 MED ORDER — TADALAFIL 10 MG PO TABS
10.0000 mg | ORAL_TABLET | ORAL | 1 refills | Status: DC | PRN
  Filled 2024-01-13: qty 10, 50d supply, fill #0
  Filled 2024-03-25: qty 10, 50d supply, fill #1

## 2024-01-13 MED ORDER — MOUNJARO 2.5 MG/0.5ML ~~LOC~~ SOAJ
2.5000 mg | SUBCUTANEOUS | 0 refills | Status: DC
  Filled 2024-01-13: qty 2, 28d supply, fill #0

## 2024-01-13 MED ORDER — DEXCOM G7 SENSOR MISC
1.0000 | 1 refills | Status: DC
  Filled 2024-01-13: qty 6, 84d supply, fill #0
  Filled 2024-03-25 – 2024-04-05 (×2): qty 6, 84d supply, fill #1

## 2024-01-13 MED ORDER — HYDROXYZINE PAMOATE 25 MG PO CAPS
25.0000 mg | ORAL_CAPSULE | Freq: Three times a day (TID) | ORAL | 0 refills | Status: DC | PRN
  Filled 2024-01-13: qty 30, 10d supply, fill #0

## 2024-01-13 NOTE — Progress Notes (Signed)
 New Patient Office Visit  Subjective    Patient ID: Jonathan Hendrix, male    DOB: Nov 09, 1991  Age: 33 y.o. MRN: [id number]  CC:  Chief Complaint  Patient presents with   Establish Care    HPI AABAN GRIEP presents to establish care   HLD: was on atorvastatin 10 in the past and he had joint pains.   DM2: has tried ozempic and has issues for getting the medicaoitns. He was switched to rybellsus and had tried it but had stomach upset.  Patient states he has tried metformin in the past but could not tolerate due to GI distress  GAD: was on zolft in the past. Did well with it in the past.  Patient is endorsing lots of stress professionally and personally.  Insomnia: has been on amein. He will go to bed around 9 and get to sleep at 10. He will get up around 6. Does feel rested if he sleeps  OSA: he does have a CPAP with auto titration.  Tolerates it well  Erectile dysfunction: Patient states he is able to get an erection but unable to maintain erection he thinks this is secondary to uncontrolled diabetes.  for complete physical and follow up of chronic conditions.  Immunizations: -Tetanus: Completed in up to date  -Influenza:  through employment  -Shingles: Too young -Pneumonia: update today   Diet: Fair diet. He will eat 3 meals a day most days it is 2. Does snack when he gets home. He will do water and sprite zero or diet drinks Exercise: No regular exercise. Some. He use to run. He will walk around the neighborhood. Will walk 10-12K steps a day at work  Eye exam: Needs updating Dental exam: Completes semi-annually    Colonoscopy: Too young, currently average risk Lung Cancer Screening: N/A  PSA: Too young, currently average risk       01/13/2024    9:35 AM  PHQ9 SCORE ONLY  PHQ-9 Total Score 16       01/13/2024    9:36 AM  GAD 7 : Generalized Anxiety Score  Nervous, Anxious, on Edge 3  Control/stop worrying 2  Worry too much - different things 2  Trouble  relaxing 2  Restless 1  Easily annoyed or irritable 2  Afraid - awful might happen 0  Total GAD 7 Score 12  Anxiety Difficulty Very difficult      Outpatient Encounter Medications as of 01/13/2024  Medication Sig   Continuous Glucose Sensor (DEXCOM G7 SENSOR) MISC 1 each by Does not apply route as directed.   hydrOXYzine (VISTARIL) 25 MG capsule Take 1 capsule (25 mg total) by mouth every 8 (eight) hours as needed.   sertraline (ZOLOFT) 50 MG tablet Take 0.5 tablets (25 mg total) by mouth daily for 14 days, THEN 1 tablet (50 mg total) daily for 16 days.   tadalafil (CIALIS) 10 MG tablet Take 1 tablet (10 mg total) by mouth every other day as needed for erectile dysfunction.   tirzepatide [facility]) 2.5 MG/0.5ML Pen Inject 2.5 mg into the skin once a week.   [DISCONTINUED] atorvastatin (LIPITOR) 10 MG tablet Take 1 tablet by mouth daily. (Patient not taking: Reported on 01/13/2024)   [DISCONTINUED] atorvastatin (LIPITOR) 10 MG tablet Take 1 tablet by mouth daily (Patient not taking: Reported on 01/13/2024)   [DISCONTINUED] Continuous Blood Gluc Receiver (FREESTYLE LIBRE 14 DAY READER) DEVI Use as directed per instructions in pack (Patient not taking: Reported on 01/13/2024)   [DISCONTINUED]  Exenatide ER (BYDUREON BCISE) 2 MG/0.85ML AUIJ Inject 1 pen under the skin once a week (Patient not taking: Reported on 01/13/2024)   [DISCONTINUED] Semaglutide (RYBELSUS) 7 MG TABS Take 1 tablet by mouth daily, start on 08/16/22. (Patient not taking: Reported on 01/13/2024)   [DISCONTINUED] Semaglutide (RYBELSUS) 7 MG TABS Take 1 tablet (7 mg total) by mouth daily. (Patient not taking: Reported on 01/13/2024)   [DISCONTINUED] sertraline (ZOLOFT) 50 MG tablet Take 2 tablets (100 mg total) by mouth daily. (Patient not taking: Reported on 01/13/2024)   [DISCONTINUED] zolpidem (AMBIEN) 10 MG tablet Take 1 tablet by mouth at bedtime (Patient not taking: Reported on 01/13/2024)   No facility-administered encounter medications on  file as of 01/13/2024.    Past Medical History:  Diagnosis Date   Diabetes mellitus without complication (HCC)     Past Surgical History:  Procedure Laterality Date   CHOLECYSTECTOMY     SHOULDER SURGERY Left     Family History  Problem Relation Age of Onset   Hyperlipidemia Mother    Hypertension Mother    Diabetes Father    Hypertension Father    Atrial fibrillation Father    Cancer Paternal Grandfather        pancreatic    Social History   Socioeconomic History   Marital status: Married    Spouse name: Morrie Sheldon   Number of children: 1   Years of education: Not on file   Highest education level: Not on file  Occupational History   Not on file  Tobacco Use   Smoking status: Never   Smokeless tobacco: Never  Vaping Use   Vaping status: Never Used  Substance and Sexual Activity   Alcohol use: Not Currently    Comment: 4-5 beer on weekend    Drug use: Yes    Types: Marijuana    Comment: CBD gummies   Sexual activity: Not on file  Other Topics Concern   Not on file  Social History Narrative   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  Respiratory:  Negative for shortness of breath.   Cardiovascular:  Negative for chest pain and leg swelling.  Gastrointestinal:  Negative for abdominal pain, blood in stool, constipation, diarrhea, nausea and vomiting.       Bm daily   Genitourinary:  Negative for dysuria and hematuria.  Neurological:  Negative for tingling and headaches.  Psychiatric/Behavioral:  Negative for hallucinations and suicidal ideas.         Objective    BP 130/88   Pulse 60   Temp 97.8 F (36.6 C) (Oral)    Ht 6\' 2"  (1.88 m)   Wt 274 lb 6.4 oz (124.5 kg)   SpO2 97%   BMI 35.23 kg/m   Physical Exam Vitals and nursing note reviewed.  Constitutional:      Appearance: Normal appearance.  HENT:     Right Ear: Tympanic membrane, ear canal and external ear normal.     Left Ear: Tympanic membrane, ear canal and external ear normal.     Mouth/Throat:     Mouth: Mucous membranes  are moist.     Pharynx: Oropharynx is clear.  Eyes:     Extraocular Movements: Extraocular movements intact.     Pupils: Pupils are equal, round, and reactive to light.  Cardiovascular:     Rate and Rhythm: Normal rate and regular rhythm.     Pulses: Normal pulses.     Heart sounds: Normal heart sounds.  Pulmonary:     Effort: Pulmonary effort is normal.     Breath sounds: Normal breath sounds.  Abdominal:     General: Bowel sounds are normal. There is no distension.     Palpations: There is no mass.     Tenderness: There is no abdominal tenderness.     Hernia: No hernia is present.     Comments: Rectus diastasis   Musculoskeletal:     Right lower leg: No edema.     Left lower leg: No edema.  Lymphadenopathy:     Cervical: Cervical adenopathy present.  Skin:    General: Skin is warm.  Neurological:     General: No focal deficit present.     Mental Status: He is alert.     Deep Tendon Reflexes:     Reflex Scores:      Bicep reflexes are 2+ on the right side and 2+ on the left side.      Patellar reflexes are 2+ on the right side and 2+ on the left side.    Comments: Bilateral upper and lower extremity strength 5/5  Psychiatric:        Mood and Affect: Mood normal.        Behavior: Behavior normal.        Thought Content: Thought content normal.        Judgment: Judgment normal.    Title   Diabetic Foot Exam - detailed Date & Time: 01/13/2024 10:03 AM Diabetic Foot exam was performed with the following findings: Yes  Is there a history of foot ulcer?: No Is there a foot ulcer now?: No Is there  swelling?: No Is there elevated skin temperature?: No Is there abnormal foot shape?: No Is there a claw toe deformity?: No Are the toenails long?: No Are the toenails thick?: No Are the toenails ingrown?: No Is the skin thin, fragile, shiny and hairless?": No Pulse Foot Exam completed.: Yes   Right Posterior Tibialis: Present Left posterior Tibialis: Present   Right Dorsalis Pedis: Present Left Dorsalis Pedis: Present     Sensory Foot Exam Completed.: Yes Semmes-Weinstein Monofilament Test "+" means "has sensation" and "-" means "no sensation"      Image components are not supported.   Image components are not supported. Image components are not supported.  Tuning [place]ork Comments All 10 sites tested sensation intact bilaterally  Decreased sensation on 1 bilaterally Decreased sensation on 2,3 on the left foot          Assessment & Plan:   Problem List Items Addressed This Visit       Endocrine   Uncontrolled type 2 diabetes mellitus with hyperglycemia (HCC)   Patient has tried and failed metformin in the past.  He was unable to tolerate the GI side effects.  Was on Ozempic and did well until those a stock issue was switched to Rybelsus and did not tolerate that medication well because of his ever-changing work schedule.  He has had a CGM in the past refill provided today.  Will start patient on Mounjaro 2.5 mg once a week with anticipation of titrate  up to 5 mg weekly thereafter.  Patient was on atorvastatin 10 mg for risk reduction but did not tolerate it due to joint aches and pains.  Pending lipid panel today      Relevant Medications   Continuous Glucose Sensor (DEXCOM G7 SENSOR) MISC   tirzepatide [facility]) 2.5 MG/0.5ML Pen   Other Relevant Orders   POCT glycosylated hemoglobin (Hb A1C) (Completed)   CBC   Comprehensive metabolic panel   TSH   Microalbumin / creatinine urine ratio   Lipid panel   Pneumococcal conjugate vaccine 20-valent (Prevnar 20)  (Completed)     Other   Encounter for preventative adult health care exam with abnormal findings - Primary   Discussed age-appropriate immunization screening exams.  Did review patient's personal, surgical, social, family histories.  Patient is up-to-date on all age-appropriate vaccinations he would like.  Update Prevnar 20 today.  Patient is too young for CRC screening or prostate cancer screening.  Patient was given information at discharge about preventative healthcare maintenance with anticipatory guidance.      Obesity (BMI 30-39.9)   Continue working healthy lifestyle modifications.  Patient has lost a large amount of weight as he used to be over 400 pounds.  Pending TSH and lipid panel today.      Relevant Medications   tirzepatide (MOUNJARO) 2.5 MG/0.5ML Pen   Anxiety and depression   History of the same.  Patient was on sertraline 50 mg in the past.  Will restart medication start him on 25 mg daily for 2 weeks and then titrate up to 50 mg daily thereafter.  Patient denies HI/SI/AVH.      Relevant Medications   sertraline (ZOLOFT) 50 MG tablet   hydrOXYzine (VISTARIL) 25 MG capsule   Other Relevant Orders   CBC   Comprehensive metabolic panel   TSH   Erectile dysfunction   Likely secondary to uncontrolled diabetes.  Patient is able to obtain erection but not maintain erection we will try tadalafil 10 mg as needed sexual intercourse.      Relevant Medications   tadalafil (CIALIS) 10 MG tablet   Other Visit Diagnoses       Encounter for hepatitis C screening test for low risk patient       Relevant Orders   Hepatitis C Antibody     Encounter for screening for HIV       Relevant Orders   HIV antibody (with reflex)     Need for pneumococcal 20-valent conjugate vaccination       Relevant Orders   Pneumococcal conjugate vaccine 20-valent (Prevnar 20) (Completed)       Return in about 3 months (around 04/14/2024) for DM recheck.   Audria Nine, NP

## 2024-01-13 NOTE — Assessment & Plan Note (Signed)
 Likely secondary to uncontrolled diabetes.  Patient is able to obtain erection but not maintain erection we will try tadalafil 10 mg as needed sexual intercourse.

## 2024-01-13 NOTE — Assessment & Plan Note (Signed)
 History of the same.  Patient was on sertraline 50 mg in the past.  Will restart medication start him on 25 mg daily for 2 weeks and then titrate up to 50 mg daily thereafter.  Patient denies HI/SI/AVH.

## 2024-01-13 NOTE — Patient Instructions (Signed)
 Nice to see you today I will be in touch with the labs once I have them Follow up with me in 3 months, sooner if you need me

## 2024-01-13 NOTE — Assessment & Plan Note (Addendum)
 Continue working healthy lifestyle modifications.  Patient has lost a large amount of weight as he used to be over 400 pounds.  Pending TSH and lipid panel today.

## 2024-01-13 NOTE — Assessment & Plan Note (Addendum)
 Patient has tried and failed metformin in the past.  He was unable to tolerate the GI side effects.  Was on Ozempic and did well until those a stock issue was switched to Rybelsus and did not tolerate that medication well because of his ever-changing work schedule.  He has had a CGM in the past refill provided today.  Will start patient on Mounjaro 2.5 mg once a week with anticipation of titrate up to 5 mg weekly thereafter.  Patient was on atorvastatin 10 mg for risk reduction but did not tolerate it due to joint aches and pains.  Pending lipid panel today

## 2024-01-13 NOTE — Assessment & Plan Note (Signed)
 Discussed age-appropriate immunization screening exams.  Did review patient's personal, surgical, social, family histories.  Patient is up-to-date on all age-appropriate vaccinations he would like.  Update Prevnar 20 today.  Patient is too young for CRC screening or prostate cancer screening.  Patient was given information at discharge about preventative healthcare maintenance with anticipatory guidance.

## 2024-01-14 ENCOUNTER — Other Ambulatory Visit: Payer: Self-pay

## 2024-01-14 LAB — HIV ANTIBODY (ROUTINE TESTING W REFLEX): HIV 1&2 Ab, 4th Generation: NONREACTIVE

## 2024-01-14 LAB — HEPATITIS C ANTIBODY: Hepatitis C Ab: NONREACTIVE

## 2024-01-15 ENCOUNTER — Other Ambulatory Visit: Payer: Self-pay

## 2024-01-15 ENCOUNTER — Other Ambulatory Visit: Payer: Self-pay | Admitting: Nurse Practitioner

## 2024-01-15 ENCOUNTER — Other Ambulatory Visit (HOSPITAL_COMMUNITY): Payer: Self-pay

## 2024-01-15 ENCOUNTER — Encounter: Payer: Self-pay | Admitting: Nurse Practitioner

## 2024-01-15 DIAGNOSIS — E785 Hyperlipidemia, unspecified: Secondary | ICD-10-CM

## 2024-01-15 DIAGNOSIS — E1165 Type 2 diabetes mellitus with hyperglycemia: Secondary | ICD-10-CM

## 2024-01-15 MED ORDER — ROSUVASTATIN CALCIUM 5 MG PO TABS
5.0000 mg | ORAL_TABLET | Freq: Every day | ORAL | 1 refills | Status: DC
Start: 2024-01-15 — End: 2024-08-19
  Filled 2024-01-15: qty 90, 90d supply, fill #0
  Filled 2024-02-10 – 2024-04-05 (×2): qty 90, 90d supply, fill #1

## 2024-01-15 MED ORDER — LOSARTAN POTASSIUM 25 MG PO TABS
25.0000 mg | ORAL_TABLET | Freq: Every day | ORAL | 1 refills | Status: DC
  Filled 2024-01-15: qty 90, 90d supply, fill #0
  Filled 2024-02-10 – 2024-04-05 (×2): qty 90, 90d supply, fill #1

## 2024-01-19 ENCOUNTER — Encounter (HOSPITAL_COMMUNITY): Payer: Self-pay

## 2024-01-20 ENCOUNTER — Other Ambulatory Visit (HOSPITAL_COMMUNITY): Payer: Self-pay

## 2024-01-23 ENCOUNTER — Other Ambulatory Visit (HOSPITAL_COMMUNITY): Payer: Self-pay

## 2024-01-23 ENCOUNTER — Other Ambulatory Visit: Payer: Self-pay

## 2024-01-23 ENCOUNTER — Telehealth: Payer: Self-pay

## 2024-01-23 NOTE — Telephone Encounter (Signed)
 Pharmacy Patient Advocate Encounter  Received notification from [facility] that Prior Authorization for Dexcom G7 Sensor  has been APPROVED from 01/23/24 to 01/22/25. Ran test claim, Copay is $139.66. This test claim was processed through [facility]- copay amounts may vary at other pharmacies due to pharmacy/plan contracts, or as the patient moves through the different stages of their insurance plan.   PA #/Case ID/Reference #: [id number]

## 2024-01-23 NOTE — Telephone Encounter (Signed)
 Pharmacy Patient Advocate Encounter   Received notification from CoverMyMeds that prior authorization for Dexcom G7 Sensor is required/requested.   Insurance verification completed.   The patient is insured through [facility] .   Per test claim: PA required; PA submitted to above mentioned insurance via CoverMyMeds Key/confirmation #/EOC [id number] Status is pending

## 2024-01-27 ENCOUNTER — Other Ambulatory Visit (HOSPITAL_COMMUNITY): Payer: Self-pay

## 2024-01-27 ENCOUNTER — Encounter: Payer: Self-pay | Admitting: Pharmacist

## 2024-01-27 ENCOUNTER — Other Ambulatory Visit: Payer: Self-pay

## 2024-02-10 ENCOUNTER — Other Ambulatory Visit: Payer: Self-pay

## 2024-02-10 ENCOUNTER — Other Ambulatory Visit (HOSPITAL_COMMUNITY): Payer: Self-pay

## 2024-02-10 ENCOUNTER — Other Ambulatory Visit: Payer: Self-pay | Admitting: Nurse Practitioner

## 2024-02-10 DIAGNOSIS — F32A Depression, unspecified: Secondary | ICD-10-CM

## 2024-02-10 DIAGNOSIS — E1165 Type 2 diabetes mellitus with hyperglycemia: Secondary | ICD-10-CM

## 2024-02-11 ENCOUNTER — Other Ambulatory Visit (HOSPITAL_COMMUNITY): Payer: Self-pay

## 2024-02-11 MED ORDER — TIRZEPATIDE 5 MG/0.5ML ~~LOC~~ SOAJ
5.0000 mg | SUBCUTANEOUS | 1 refills | Status: DC
  Filled 2024-02-11: qty 2, 28d supply, fill #0
  Filled 2024-03-06: qty 2, 28d supply, fill #1

## 2024-02-11 MED ORDER — SERTRALINE HCL 50 MG PO TABS
50.0000 mg | ORAL_TABLET | Freq: Every day | ORAL | 0 refills | Status: DC
  Filled 2024-02-11: qty 90, 90d supply, fill #0

## 2024-02-12 ENCOUNTER — Other Ambulatory Visit: Payer: Self-pay

## 2024-02-14 ENCOUNTER — Other Ambulatory Visit (HOSPITAL_COMMUNITY): Payer: Self-pay

## 2024-03-06 ENCOUNTER — Other Ambulatory Visit (HOSPITAL_COMMUNITY): Payer: Self-pay

## 2024-03-09 ENCOUNTER — Encounter: Payer: Self-pay | Admitting: Nurse Practitioner

## 2024-03-10 ENCOUNTER — Other Ambulatory Visit (HOSPITAL_COMMUNITY): Payer: Self-pay

## 2024-03-10 ENCOUNTER — Other Ambulatory Visit: Payer: Self-pay

## 2024-03-10 MED ORDER — SERTRALINE HCL 100 MG PO TABS
100.0000 mg | ORAL_TABLET | Freq: Every day | ORAL | 0 refills | Status: DC
  Filled 2024-03-10: qty 90, 90d supply, fill #0

## 2024-03-10 NOTE — Telephone Encounter (Signed)
 Needs a 4 week follow up for MDD

## 2024-03-10 NOTE — Addendum Note (Signed)
 Addended by: Dorothe Gaster on: 03/10/2024 01:24 PM   Modules accepted: Orders

## 2024-03-10 NOTE — Telephone Encounter (Signed)
 Patient is already scheduled for 6/5. Please advise if we need to add another appt

## 2024-03-10 NOTE — Telephone Encounter (Signed)
 Pt is scheduled 6/4 for DM recheck. Pt needs 4 week follow up for MDD Recheck.  Does he need another appt for MDD or would you cover both complaints in one visit?

## 2024-03-25 ENCOUNTER — Other Ambulatory Visit: Payer: Self-pay | Admitting: Nurse Practitioner

## 2024-03-25 DIAGNOSIS — F32A Depression, unspecified: Secondary | ICD-10-CM

## 2024-03-26 ENCOUNTER — Other Ambulatory Visit: Payer: Self-pay

## 2024-03-26 ENCOUNTER — Other Ambulatory Visit (HOSPITAL_COMMUNITY): Payer: Self-pay

## 2024-03-26 MED ORDER — TIRZEPATIDE 5 MG/0.5ML ~~LOC~~ SOAJ
5.0000 mg | SUBCUTANEOUS | 0 refills | Status: DC
  Filled 2024-03-26 – 2024-03-29 (×2): qty 2, 28d supply, fill #0

## 2024-03-26 MED ORDER — HYDROXYZINE PAMOATE 25 MG PO CAPS
25.0000 mg | ORAL_CAPSULE | Freq: Three times a day (TID) | ORAL | 0 refills | Status: DC | PRN
  Filled 2024-03-26: qty 30, 10d supply, fill #0

## 2024-03-26 NOTE — Addendum Note (Signed)
 Addended by: Dorothe Gaster on: 03/26/2024 04:49 PM   Modules accepted: Orders

## 2024-03-27 ENCOUNTER — Other Ambulatory Visit: Payer: Self-pay

## 2024-03-29 ENCOUNTER — Other Ambulatory Visit (HOSPITAL_COMMUNITY): Payer: Self-pay

## 2024-04-06 ENCOUNTER — Other Ambulatory Visit (HOSPITAL_COMMUNITY): Payer: Self-pay

## 2024-04-06 ENCOUNTER — Other Ambulatory Visit: Payer: Self-pay

## 2024-04-14 ENCOUNTER — Other Ambulatory Visit (HOSPITAL_COMMUNITY): Payer: Self-pay

## 2024-04-14 ENCOUNTER — Other Ambulatory Visit: Payer: Self-pay

## 2024-04-14 ENCOUNTER — Ambulatory Visit: Admitting: Nurse Practitioner

## 2024-04-14 VITALS — BP 122/74 | HR 77 | Temp 97.8°F | Ht 74.0 in | Wt 267.8 lb

## 2024-04-14 DIAGNOSIS — E1165 Type 2 diabetes mellitus with hyperglycemia: Secondary | ICD-10-CM | POA: Diagnosis not present

## 2024-04-14 DIAGNOSIS — E669 Obesity, unspecified: Secondary | ICD-10-CM

## 2024-04-14 DIAGNOSIS — F419 Anxiety disorder, unspecified: Secondary | ICD-10-CM | POA: Diagnosis not present

## 2024-04-14 DIAGNOSIS — F99 Mental disorder, not otherwise specified: Secondary | ICD-10-CM | POA: Diagnosis not present

## 2024-04-14 DIAGNOSIS — N529 Male erectile dysfunction, unspecified: Secondary | ICD-10-CM | POA: Diagnosis not present

## 2024-04-14 DIAGNOSIS — F32A Depression, unspecified: Secondary | ICD-10-CM

## 2024-04-14 DIAGNOSIS — F5105 Insomnia due to other mental disorder: Secondary | ICD-10-CM | POA: Insufficient documentation

## 2024-04-14 LAB — POCT GLYCOSYLATED HEMOGLOBIN (HGB A1C): Hemoglobin A1C: 9 % — AB (ref 4.0–5.6)

## 2024-04-14 MED ORDER — SILDENAFIL CITRATE 100 MG PO TABS
50.0000 mg | ORAL_TABLET | Freq: Every day | ORAL | 1 refills | Status: DC | PRN
  Filled 2024-04-14: qty 10, 10d supply, fill #0
  Filled 2024-05-27: qty 10, 10d supply, fill #1

## 2024-04-14 MED ORDER — MOUNJARO 7.5 MG/0.5ML ~~LOC~~ SOAJ
7.5000 mg | SUBCUTANEOUS | 0 refills | Status: DC
Start: 2024-04-14 — End: 2024-07-26
  Filled 2024-04-14: qty 6, 84d supply, fill #0
  Filled 2024-04-21: qty 2, 28d supply, fill #0
  Filled 2024-05-27: qty 2, 28d supply, fill #1
  Filled 2024-06-24: qty 2, 28d supply, fill #2

## 2024-04-14 NOTE — Assessment & Plan Note (Signed)
 History of the same.  Patient is working on lifestyle applications.  He has been losing weight continue working on lifestyle modifications

## 2024-04-14 NOTE — Assessment & Plan Note (Signed)
 Patient tried tadalafil  10 and 20 mg tablets.  Without good relief.  We will trial patient on sildenafil 50 mg to 100 mg nightly as needed sexual intercourse.  Also pending testosterone levels today.  If all normal and sildenafil is ineffective urology referral

## 2024-04-14 NOTE — Assessment & Plan Note (Signed)
 Secondary to patient's anxiety and depression.  He will try hydroxyzine  50 mg nightly if beneficial we will send in new prescription.  If not beneficial consider trying patient on trazodone.

## 2024-04-14 NOTE — Patient Instructions (Signed)
 Nice to see you today Try 50mg  of the hydroxyzine . Message me and let me know how that goes Follow up with me in 3 months

## 2024-04-14 NOTE — Assessment & Plan Note (Signed)
 Patient's A1c has trended down to 9.0.  Patient is tolerating Mounjaro  well.  Will increase Mounjaro  to 7.5 mg once a week.  Patient has lost approximately 7 pounds since last office visit.  Continue working healthy lifestyle modifications continue using CGM.

## 2024-04-14 NOTE — Assessment & Plan Note (Signed)
 History of the same.  Has been sertraline  100 mg for approximately 4 weeks.  Will give it a few more weeks prior to titrating up.  Patient can reach out to me via MyChart.  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  Patient denies HI/SI/AVH.
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
## 2024-04-15 ENCOUNTER — Ambulatory Visit: Payer: Self-pay | Admitting: Nurse Practitioner

## 2024-04-15 LAB — TESTOSTERONE TOTAL,FREE,BIO, MALES
Albumin: 4.6 g/dL (ref 3.6–5.1)
Sex Hormone Binding: 13 nmol/L (ref 10–50)
Testosterone, Bioavailable: 141.8 ng/dL (ref 110.0–575.0)
Testosterone, Free: 67.5 pg/mL (ref 46.0–224.0)
Testosterone: 284 ng/dL (ref 250–827)

## 2024-04-21 ENCOUNTER — Other Ambulatory Visit (HOSPITAL_COMMUNITY): Payer: Self-pay

## 2024-04-21 ENCOUNTER — Other Ambulatory Visit: Payer: Self-pay

## 2024-04-21 ENCOUNTER — Encounter: Payer: Self-pay | Admitting: Nurse Practitioner

## 2024-04-21 MED ORDER — TRAZODONE HCL 50 MG PO TABS
50.0000 mg | ORAL_TABLET | Freq: Every evening | ORAL | 0 refills | Status: AC | PRN
  Filled 2024-04-21: qty 30, 30d supply, fill #0

## 2024-05-27 ENCOUNTER — Other Ambulatory Visit: Payer: Self-pay

## 2024-05-27 ENCOUNTER — Other Ambulatory Visit (HOSPITAL_COMMUNITY): Payer: Self-pay

## 2024-06-01 ENCOUNTER — Other Ambulatory Visit: Payer: Self-pay

## 2024-06-01 ENCOUNTER — Other Ambulatory Visit (HOSPITAL_COMMUNITY): Payer: Self-pay

## 2024-06-01 ENCOUNTER — Other Ambulatory Visit: Payer: Self-pay | Admitting: Nurse Practitioner

## 2024-06-01 MED ORDER — SERTRALINE HCL 100 MG PO TABS
100.0000 mg | ORAL_TABLET | Freq: Every day | ORAL | 0 refills | Status: DC
  Filled 2024-06-01: qty 90, 90d supply, fill #0

## 2024-06-02 ENCOUNTER — Other Ambulatory Visit: Payer: Self-pay

## 2024-06-24 ENCOUNTER — Other Ambulatory Visit: Payer: Self-pay

## 2024-06-24 ENCOUNTER — Other Ambulatory Visit: Payer: Self-pay | Admitting: Nurse Practitioner

## 2024-06-24 ENCOUNTER — Encounter: Payer: Self-pay | Admitting: Nurse Practitioner

## 2024-06-24 ENCOUNTER — Other Ambulatory Visit (HOSPITAL_COMMUNITY): Payer: Self-pay

## 2024-06-24 DIAGNOSIS — E1165 Type 2 diabetes mellitus with hyperglycemia: Secondary | ICD-10-CM

## 2024-06-24 NOTE — Telephone Encounter (Signed)
 Can we see if he will be evaluated in office please

## 2024-06-25 ENCOUNTER — Other Ambulatory Visit (HOSPITAL_COMMUNITY): Payer: Self-pay

## 2024-06-25 MED ORDER — DEXCOM G7 SENSOR MISC
1.0000 | 1 refills | Status: AC
  Filled 2024-06-25: qty 6, 60d supply, fill #0

## 2024-07-03 ENCOUNTER — Other Ambulatory Visit: Payer: Self-pay | Admitting: Medical Genetics

## 2024-07-26 ENCOUNTER — Ambulatory Visit: Admitting: Nurse Practitioner

## 2024-07-26 ENCOUNTER — Other Ambulatory Visit (HOSPITAL_COMMUNITY): Payer: Self-pay

## 2024-07-26 VITALS — BP 120/80 | HR 61 | Temp 98.1°F | Ht 74.0 in | Wt 273.2 lb

## 2024-07-26 DIAGNOSIS — F419 Anxiety disorder, unspecified: Secondary | ICD-10-CM | POA: Diagnosis not present

## 2024-07-26 DIAGNOSIS — Z7985 Long-term (current) use of injectable non-insulin antidiabetic drugs: Secondary | ICD-10-CM | POA: Diagnosis not present

## 2024-07-26 DIAGNOSIS — G4700 Insomnia, unspecified: Secondary | ICD-10-CM

## 2024-07-26 DIAGNOSIS — E669 Obesity, unspecified: Secondary | ICD-10-CM | POA: Diagnosis not present

## 2024-07-26 DIAGNOSIS — F329 Major depressive disorder, single episode, unspecified: Secondary | ICD-10-CM | POA: Diagnosis not present

## 2024-07-26 DIAGNOSIS — N529 Male erectile dysfunction, unspecified: Secondary | ICD-10-CM

## 2024-07-26 DIAGNOSIS — E1165 Type 2 diabetes mellitus with hyperglycemia: Secondary | ICD-10-CM

## 2024-07-26 LAB — POCT GLYCOSYLATED HEMOGLOBIN (HGB A1C): Hemoglobin A1C: 8 % — AB (ref 4.0–5.6)

## 2024-07-26 MED ORDER — TIRZEPATIDE 10 MG/0.5ML ~~LOC~~ SOAJ
10.0000 mg | SUBCUTANEOUS | 0 refills | Status: DC
  Filled 2024-07-26: qty 2, 28d supply, fill #0
  Filled 2024-08-19: qty 2, 28d supply, fill #1
  Filled 2024-10-14: qty 2, 28d supply, fill #2

## 2024-07-26 NOTE — Patient Instructions (Signed)
 Nice to see you today  I am going to increase mounjaro  to 10mg  weekly

## 2024-07-26 NOTE — Progress Notes (Signed)
 Established Patient Office Visit  Subjective   Patient ID: Jonathan Hendrix, male    DOB: [birth year]  Age: 33 y.o. MRN: [id number]  Chief Complaint  Patient presents with   Diabetes      Discussed the use of AI scribe software for clinical note transcription with the patient, who gave verbal consent to proceed.  History of Present Illness Jonathan Hendrix is a 33 year old male with diabetes who presents for follow-up on diabetes management and mental health.  He has been actively managing his diabetes by making dietary changes, such as reducing diet soda intake and eating smaller meals in the evening. His blood glucose levels, monitored using a Dexcom device, have ranged from 175 mg/dL to 699 mg/dL after consuming carbohydrates. He is currently on Mounjaro  7.5 mg, which he tolerates well, and his A1c has improved from 12% to 8%.  He is starting a new job as a [employer], [employer], which he anticipates will provide a better work-life balance compared to his current demanding role. He hopes the new position will allow for more consistent meal planning and exercise routines.  Regarding his mental health, he feels good on Zoloft  100 mg, with improved mood and no longer needing hydroxyzine  for sleep. He has been getting at least six hours of sleep per night, has reduced screen time, and spends more time with his daughter. He has abstained from [facility] for almost five months and has not consumed alcohol this year, which he believes has contributed to his improved mood. He completed 14 therapy sessions through his employment, which he found beneficial.  He has been engaging in more physical activity, walking at least three days a week, and plans to start resistance training once his new job begins. He has been more active with his daughter, chasing her around the neighborhood on her new pink motorcycle.  No fever, chills, chest pain, shortness of breath, or thoughts of self-harm  or harm to others.    Review of Systems  Constitutional:  Negative for chills and fever.  Respiratory:  Negative for shortness of breath.   Cardiovascular:  Negative for chest pain.  Gastrointestinal:  Negative for abdominal pain, constipation, nausea and vomiting.  Psychiatric/Behavioral:  Negative for hallucinations and suicidal ideas. The patient does not have insomnia.       Objective:     BP 120/80   Pulse 61   Temp 98.1 F (36.7 C) (Oral)   Ht 6' 2 (1.88 m)   Wt 273 lb 3.2 oz (123.9 kg)   SpO2 98%   BMI 35.08 kg/m  BP Readings from Last 3 Encounters:  07/26/24 120/80  04/14/24 122/74  01/13/24 130/88   Wt Readings from Last 3 Encounters:  07/26/24 273 lb 3.2 oz (123.9 kg)  04/14/24 267 lb 12.8 oz (121.5 kg)  01/13/24 274 lb 6.4 oz (124.5 kg)   SpO2 Readings from Last 3 Encounters:  07/26/24 98%  04/14/24 93%  01/13/24 97%      Physical Exam Vitals and nursing note reviewed.  Constitutional:      Appearance: Normal appearance.  Cardiovascular:     Rate and Rhythm: Normal rate and regular rhythm.     Heart sounds: Normal heart sounds.  Pulmonary:     Effort: Pulmonary effort is normal.     Breath sounds: Normal breath sounds.  Abdominal:     General: Bowel sounds are normal.  Neurological:     Mental Status: He is alert.  No results found for any visits on 07/26/24.    The ASCVD Risk score (Arnett DK, et al., 2019) failed to calculate for the following reasons:   The 2019 ASCVD risk score is only valid for ages 46 to 58    Assessment & Plan:   Problem List Items Addressed This Visit       Endocrine   Uncontrolled type 2 diabetes mellitus with hyperglycemia (HCC) - Primary   Relevant Medications   tirzepatide  (MOUNJARO ) 10 MG/0.5ML Pen   Other Relevant Orders   POCT glycosylated hemoglobin (Hb A1C)   Assessment and Plan Assessment & Plan Type 2 diabetes mellitus with hyperglycemia Diabetes remains uncontrolled with blood  glucose 175-300 mg/dL. A1c improved from 12% to 8%, still above target. Constipation likely due to Mounjaro . New job may aid dietary management and glycemic control. - Increase Mounjaro  to 10 mg weekly. - Send three-month supply of Mounjaro . - Follow up in three months.  Obesity (BMI 30-39.9) Weight increased slightly. Attempting dietary improvements and increased physical activity. New job may aid [employer]. - Encourage meal prepping and stable eating schedule with new job. - Encourage continuation of walking three days a week and plan to start resistance training.  Major depressive disorder and anxiety disorder Mood improved with Zoloft  100 mg daily. Better sleep, increased family presence, cessation of THC and alcohol. Completed therapy sessions. Wife on Zoloft  but struggles with adherence. - Continue Zoloft  100 mg daily. - No need for hydroxyzine  or trazodone  unless needed.  Erectile dysfunction Sildenafil  effective. - Continue sildenafil  as needed.  Insomnia related to mental health Insomnia improved with lifestyle changes, reduced screen time, increased reading. Not using sleep aids. - Continue current lifestyle modifications for sleep improvement.  Return in about 3 months (around 10/25/2024) for DM recheck.    Adina Crandall, NP

## 2024-08-02 ENCOUNTER — Other Ambulatory Visit (HOSPITAL_COMMUNITY): Payer: Self-pay

## 2024-08-02 ENCOUNTER — Other Ambulatory Visit: Payer: Self-pay | Admitting: Nurse Practitioner

## 2024-08-02 ENCOUNTER — Other Ambulatory Visit: Payer: Self-pay

## 2024-08-02 MED ORDER — SERTRALINE HCL 100 MG PO TABS
100.0000 mg | ORAL_TABLET | Freq: Every day | ORAL | 0 refills | Status: AC
  Filled 2024-08-02: qty 90, 90d supply, fill #0

## 2024-08-19 ENCOUNTER — Other Ambulatory Visit: Payer: Self-pay | Admitting: Nurse Practitioner

## 2024-08-19 ENCOUNTER — Other Ambulatory Visit: Payer: Self-pay

## 2024-08-19 DIAGNOSIS — E1165 Type 2 diabetes mellitus with hyperglycemia: Secondary | ICD-10-CM

## 2024-08-19 DIAGNOSIS — E1169 Type 2 diabetes mellitus with other specified complication: Secondary | ICD-10-CM

## 2024-08-19 DIAGNOSIS — N529 Male erectile dysfunction, unspecified: Secondary | ICD-10-CM

## 2024-08-20 ENCOUNTER — Other Ambulatory Visit: Payer: Self-pay

## 2024-08-20 ENCOUNTER — Other Ambulatory Visit (HOSPITAL_COMMUNITY): Payer: Self-pay

## 2024-08-20 MED ORDER — SILDENAFIL CITRATE 100 MG PO TABS
50.0000 mg | ORAL_TABLET | Freq: Every day | ORAL | 1 refills | Status: AC | PRN
  Filled 2024-08-20: qty 10, 10d supply, fill #0
  Filled 2024-10-14: qty 10, 10d supply, fill #1

## 2024-08-20 MED ORDER — ROSUVASTATIN CALCIUM 5 MG PO TABS
5.0000 mg | ORAL_TABLET | Freq: Every day | ORAL | 1 refills | Status: AC
  Filled 2024-08-20: qty 90, 90d supply, fill #0

## 2024-08-20 MED ORDER — LOSARTAN POTASSIUM 25 MG PO TABS
25.0000 mg | ORAL_TABLET | Freq: Every day | ORAL | 1 refills | Status: AC
  Filled 2024-08-20: qty 90, 90d supply, fill #0

## 2024-08-24 ENCOUNTER — Other Ambulatory Visit (HOSPITAL_COMMUNITY)
Admission: RE | Admit: 2024-08-24 | Discharge: 2024-08-24 | Disposition: A | Discharge status: Home / Self Care | Payer: Self-pay | Source: Ambulatory Visit | Attending: Medical Genetics | Admitting: Medical Genetics

## 2024-08-31 LAB — GENECONNECT MOLECULAR SCREEN: Genetic Analysis Overall Interpretation: NEGATIVE

## 2024-10-14 ENCOUNTER — Other Ambulatory Visit (HOSPITAL_COMMUNITY): Payer: Self-pay

## 2024-10-15 ENCOUNTER — Other Ambulatory Visit: Payer: Self-pay

## 2024-10-25 ENCOUNTER — Ambulatory Visit: Admitting: Nurse Practitioner

## 2024-10-28 ENCOUNTER — Ambulatory Visit: Admitting: Nurse Practitioner

## 2024-11-14 ENCOUNTER — Other Ambulatory Visit: Payer: Self-pay | Admitting: Nurse Practitioner

## 2024-11-14 DIAGNOSIS — E1165 Type 2 diabetes mellitus with hyperglycemia: Secondary | ICD-10-CM

## 2024-11-15 MED ORDER — MOUNJARO 10 MG/0.5ML ~~LOC~~ SOAJ
10.0000 mg | SUBCUTANEOUS | 0 refills | Status: AC
  Filled 2024-11-15: qty 2, 28d supply, fill #0

## 2024-11-16 ENCOUNTER — Other Ambulatory Visit: Payer: Self-pay

## 2024-11-16 ENCOUNTER — Other Ambulatory Visit (HOSPITAL_COMMUNITY): Payer: Self-pay

## 2024-12-27 ENCOUNTER — Ambulatory Visit: Admitting: Nurse Practitioner
